# Patient Record
Sex: Male | Born: 1990 | Race: Black or African American | Hispanic: No | Marital: Single | Smoking: Current every day smoker
Health system: Southern US, Community
[De-identification: ages and names within clinical notes are randomized; demographics above are authoritative.]

---

## 2014-10-12 ENCOUNTER — Encounter (HOSPITAL_COMMUNITY): Payer: Self-pay | Admitting: Emergency Medicine

## 2014-10-12 ENCOUNTER — Emergency Department (HOSPITAL_COMMUNITY)
Admission: EM | Admit: 2014-10-12 | Discharge: 2014-10-12 | Disposition: A | Discharge status: Home / Self Care | Payer: Worker's Compensation | Attending: Emergency Medicine | Admitting: Emergency Medicine

## 2014-10-12 DIAGNOSIS — Y998 Other external cause status: Secondary | ICD-10-CM | POA: Diagnosis not present

## 2014-10-12 DIAGNOSIS — R05 Cough: Secondary | ICD-10-CM | POA: Diagnosis not present

## 2014-10-12 DIAGNOSIS — Z720 Tobacco use: Secondary | ICD-10-CM | POA: Insufficient documentation

## 2014-10-12 DIAGNOSIS — S61002A Unspecified open wound of left thumb without damage to nail, initial encounter: Secondary | ICD-10-CM | POA: Insufficient documentation

## 2014-10-12 DIAGNOSIS — Y9389 Activity, other specified: Secondary | ICD-10-CM | POA: Insufficient documentation

## 2014-10-12 DIAGNOSIS — Y9289 Other specified places as the place of occurrence of the external cause: Secondary | ICD-10-CM | POA: Insufficient documentation

## 2014-10-12 DIAGNOSIS — Y288XXA Contact with other sharp object, undetermined intent, initial encounter: Secondary | ICD-10-CM | POA: Insufficient documentation

## 2014-10-12 DIAGNOSIS — S61012A Laceration without foreign body of left thumb without damage to nail, initial encounter: Secondary | ICD-10-CM | POA: Diagnosis present

## 2014-10-12 DIAGNOSIS — R059 Cough, unspecified: Secondary | ICD-10-CM

## 2014-10-12 MED ORDER — BACITRACIN ZINC 500 UNIT/GM EX OINT
TOPICAL_OINTMENT | CUTANEOUS | Status: AC
  Administered 2014-10-12: 1
  Filled 2014-10-12: qty 0.9

## 2014-10-12 NOTE — ED Provider Notes (Signed)
CSN: [id number]     Arrival date & time 10/12/14  0732 History   First MD Initiated Contact with Patient 10/12/14 [phone]     Chief Complaint  Patient presents with  . Extremity Laceration    left pinky and thumb     The history is provided by the patient. No language interpreter was used.   [name] presents for evaluation of thumb wound and cough. 2 days ago he cut his left thumb and pinky finger on a bolt. He comes in for evaluation to make sure that the area is not infected. He has some local pain with pressure over the thumb. He denies any numbness, weakness. He denies any fevers. His tetanus shot is up-to-date. He denies any medical illness. He is a current every day smoker. His second complaint is cough. He reports 1 month of nonproductive cough. He states that he started with a cold and then the cough never went away. Cough is worse at night. He states there is a lot of dust at work and he thinks this may be contributing. He denies any chest pain, leg swelling, leg pain. Symptoms are mild and constant.  History reviewed. No pertinent past medical history. History reviewed. No pertinent past surgical history. No family history on file. History  Substance Use Topics  . Smoking status: Current Every Day Smoker    Types: Cigarettes  . Smokeless tobacco: Not on file  . Alcohol Use: Yes    Review of Systems  All other systems reviewed and are negative.     Allergies  Review of patient's allergies indicates no known allergies.  Home Medications   Prior to Admission medications   Not on File   BP 144/78 mmHg  Pulse 78  Temp(Src) 97.4 F (36.3 C) (Oral)  Resp 17  SpO2 100% Physical Exam  Constitutional: He is oriented to person, place, and time. He appears well-developed and well-nourished.  HENT:  Head: Normocephalic and atraumatic.  Mouth/Throat: Oropharynx is clear and moist.  Eyes: Pupils are equal, round, and reactive to light.  Neck: Neck supple.  Cardiovascular:  Normal rate and regular rhythm.   No murmur heard. Pulmonary/Chest: Effort normal and breath sounds normal. No respiratory distress.  Abdominal: Soft. There is no tenderness. There is no rebound and no guarding.  Musculoskeletal:  Left thumb with c shaped avulsion injury to pulp.  There is mild edema without erythema or drainage.  Full flexion/extension intact at IP joint.  Left fifth digit with abrasion injury to pulp without erythema or edema.  Flexion and extension intact throughout digit.  2+ radial pulses.    Neurological: He is alert and oriented to person, place, and time.  5/5 strength out in BUE, sensation to light touch intact throughout bilateral hands.    Nursing note and vitals reviewed.   ED Course  Procedures (including critical care time) Labs Review Labs Reviewed - No data to display  Imaging Review No results found.   EKG Interpretation None      MDM   Final diagnoses:  Open wound of thumb without complication, left, initial encounter  Cough    Patient here for evaluation of hand wound and cough. In terms of wound to the hand, this appears to be healing appropriately. There is no current evidence of infectious process. Wound was cleansed with safe and water at the bedside. Discussed with patient home care for hand wound as well as return precautions for evidence of infection. Wound does not need debridement  of tissue at this time. In terms of cough patient has no respiratory distress with clear lung exam. Clinical picture is not consistent with pneumonia, CHF, reactive airway disease. Discussed with patient home care for cough, recommend smoking cessation for wound healing and for cough. Discussed PCP follow-up and return precautions.    Tilden Fossa, MD 10/12/14 [phone]

## 2014-10-12 NOTE — ED Notes (Addendum)
Pt in room 25 with Eliezer Loftsarrie Hall whom is triaging pt at present time. Pt ambulatory to room; NAD.

## 2014-10-12 NOTE — Discharge Instructions (Signed)
Cough, Adult  A cough is a reflex that helps clear your throat and airways. It can help heal the body or may be a reaction to an irritated airway. A cough may only last 2 or 3 weeks (acute) or may last more than 8 weeks (chronic).  CAUSES Acute cough:  Viral or bacterial infections. Chronic cough:  Infections.  Allergies.  Asthma.  Post-nasal drip.  Smoking.  Heartburn or acid reflux.  Some medicines.  Chronic lung problems (COPD).  Cancer. SYMPTOMS   Cough.  Fever.  Chest pain.  Increased breathing rate.  High-pitched whistling sound when breathing (wheezing).  Colored mucus that you cough up (sputum). TREATMENT   A bacterial cough may be treated with antibiotic medicine.  A viral cough must run its course and will not respond to antibiotics.  Your caregiver may recommend other treatments if you have a chronic cough. HOME CARE INSTRUCTIONS   Only take over-the-counter or prescription medicines for pain, discomfort, or fever as directed by your caregiver. Use cough suppressants only as directed by your caregiver.  Use a cold steam vaporizer or humidifier in your bedroom or home to help loosen secretions.  Sleep in a semi-upright position if your cough is worse at night.  Rest as needed.  Stop smoking if you smoke. SEEK IMMEDIATE MEDICAL CARE IF:   You have pus in your sputum.  Your cough starts to worsen.  You cannot control your cough with suppressants and are losing sleep.  You begin coughing up blood.  You have difficulty breathing.  You develop pain which is getting worse or is uncontrolled with medicine.  You have a fever. MAKE SURE YOU:   Understand these instructions.  Will watch your condition.  Will get help right away if you are not doing well or get worse. Document Released: 01/18/2011 Document Revised: 10/14/2011 Document Reviewed: 01/18/2011 [facility] Patient Information 2015 [place], [place]. This information is not intended  to replace advice given to you by your health care provider. Make sure you discuss any questions you have with your health care provider.   Deep Skin Avulsion A deep skin avulsion is when all layers of the skin or parts of body structures have been torn away. This is usually a result of severe injury (trauma). A deep skin avulsion can include damage to important structures beneath the skin such as tendons, ligaments, nerves, or blood vessels.  CAUSES  Many injuries can lead to a deep skin avulsion. These include:   Crush injuries.  Bites.  Falls against jagged surfaces.  Gunshot wounds.  Severe burns and injuries involving dragging (such as those from a bicycle or motorcycle accident). TREATMENT   If the wound is small and there is no damage to vital structures like nerves and blood vessels, the damaged tissues may be removed. Then, the wound can be cleaned thoroughly and closed.  A skin graft may be performed. This is a procedure in which the outer layer of skin is removed from a different part of your body. That skin (skin graft) is used to cover the open wound. This can happen after damaged tissue is removed and repairs are completed.  Your caregiver may onlyapply a bandage (dressing) to the wound. The wound will be kept clean and allowed to heal. Healing can take weeks or months and usually leaves a large scar. This type of treatment is only done if your caregiver feels that skin grafting or a similar procedure would not work. You might need a tetanus shot  if:  You cannot remember when you had your last tetanus shot.  You have never had a tetanus shot.  The injury broke your skin. If you got a tetanus shot, your arm may swell, get red, and feel warm to the touch. This is common and not a problem. If you need a tetanus shot and you choose not to have one, there is a rare chance of getting tetanus. Sickness from tetanus can be serious. HOME CARE INSTRUCTIONS   Only take  over-the-counter or prescription medicines for pain, discomfort, or fever as directed by your caregiver.  Gently wash the area with mild soap and water 2 times a day, or as directed. Rinse off the soap. Pat the area dry with a clean towel. Do not rub the wound. This may cause bleeding.  Follow your caregiver's instructions for how often you need to change the dressing.  Apply ointment and a dressing to the wound as directed.  If the dressing sticks, moisten it with soapy water and gently remove it.  Change the bandage right away if it becomes wet, dirty, or starts to smell bad.  Take showers. Do not take tub baths, swim, or do anything that may soak the wound until it is healed.  Use anti-itch medicine as directed by your caregiver. The wound may itch when it is healing. Do not pick or scratch at the wound.  Follow up with your caregiver for stitches (sutures), staple, or skin adhesive strip removal. SEEK MEDICAL CARE IF:   You have redness, swelling, or increasing pain in your wound.  A red streak or line extends away from the wound.  You have pus coming from the wound.  You notice a bad smell coming from thewound or dressing.  The wound breaks open (edges not staying together) after sutures have been removed.  You notice something coming out of the wound, such as a small piece of wood, glass, or metal.  You are unable to properly move a finger or toe if the wound is on your hand or foot.  You have severe swelling around the wound that causes pain and numbness.  Your arm, hand, leg, or foot changes color. SEEK IMMEDIATE MEDICAL CARE IF:   Your pain becomes severe or is not adequately relieved with pain medicine.  You have a fever.  You have nausea and vomiting for more than 24 hours.  You feel lightheaded, weak, or faint.  You develop chest pain or difficulty breathing. MAKE SURE YOU:   Understand these instructions.  Will watch your condition.  Will get help  right away if you are not doing well or get worse. Document Released: 09/17/2006 Document Revised: 10/14/2011 Document Reviewed: 11/25/2010 [facility] Patient Information 2015 [place], [place]. This information is not intended to replace advice given to you by your health care provider. Make sure you discuss any questions you have with your health care provider. Smoking Cessation Quitting smoking is important to your health and has many advantages. However, it is not always easy to quit since nicotine is a very addictive drug. Oftentimes, people try 3 times or more before being able to quit. This document explains the best ways for you to prepare to quit smoking. Quitting takes hard work and a lot of effort, but you can do it. ADVANTAGES OF QUITTING SMOKING  You will live longer, feel better, and live better.  Your body will feel the impact of quitting smoking almost immediately.  Within 20 minutes, blood pressure decreases. Your pulse  returns to its normal level.  After 8 hours, carbon monoxide levels in the blood return to normal. Your oxygen level increases.  After 24 hours, the chance of having a heart attack starts to decrease. Your breath, hair, and body stop smelling like smoke.  After 48 hours, damaged nerve endings begin to recover. Your sense of taste and smell improve.  After 72 hours, the body is virtually free of nicotine. Your bronchial tubes relax and breathing becomes easier.  After 2 to 12 weeks, lungs can hold more air. Exercise becomes easier and circulation improves.  The risk of having a heart attack, stroke, cancer, or lung disease is greatly reduced.  After 1 year, the risk of coronary heart disease is cut in half.  After 5 years, the risk of stroke falls to the same as a nonsmoker.  After 10 years, the risk of lung cancer is cut in half and the risk of other cancers decreases significantly.  After 15 years, the risk of coronary heart disease drops, usually to the  level of a nonsmoker.  If you are pregnant, quitting smoking will improve your chances of having a healthy baby.  The people you live with, especially any children, will be healthier.  You will have extra money to spend on things other than cigarettes. QUESTIONS TO THINK ABOUT BEFORE ATTEMPTING TO QUIT You may want to talk about your answers with your health care provider.  Why do you want to quit?  If you tried to quit in the past, what helped and what did not?  What will be the most difficult situations for you after you quit? How will you plan to handle them?  Who can help you through the tough times? Your family? Friends? A health care provider?  What pleasures do you get from smoking? What ways can you still get pleasure if you quit? Here are some questions to ask your health care provider:  How can you help me to be successful at quitting?  What medicine do you think would be best for me and how should I take it?  What should I do if I need more help?  What is smoking withdrawal like? How can I get information on withdrawal? GET READY  Set a quit date.  Change your environment by getting rid of all cigarettes, ashtrays, matches, and lighters in your home, car, or work. Do not let people smoke in your home.  Review your past attempts to quit. Think about what worked and what did not. GET SUPPORT AND ENCOURAGEMENT You have a better chance of being successful if you have help. You can get support in many ways.  Tell your family, friends, and coworkers that you are going to quit and need their support. Ask them not to smoke around you.  Get individual, group, or telephone counseling and support. Programs are available at [facility] and health centers. Call your local health department for information about programs in your area.  Spiritual beliefs and practices may help some smokers quit.  Download a "quit meter" on your computer to keep track of quit statistics,  such as how long you have gone without smoking, cigarettes not smoked, and money saved.  Get a self-help book about quitting smoking and staying off tobacco. LEARN NEW SKILLS AND BEHAVIORS  Distract yourself from urges to smoke. Talk to someone, go for a walk, or occupy your time with a task.  Change your normal routine. Take a different route to work. Drink tea  instead of coffee. Eat breakfast in a different place.  Reduce your stress. Take a hot bath, exercise, or read a book.  Plan something enjoyable to do every day. Reward yourself for not smoking.  Explore interactive web-based programs that specialize in helping you quit. GET MEDICINE AND USE IT CORRECTLY Medicines can help you stop smoking and decrease the urge to smoke. Combining medicine with the above behavioral methods and support can greatly increase your chances of successfully quitting smoking.  Nicotine replacement therapy helps deliver nicotine to your body without the negative effects and risks of smoking. Nicotine replacement therapy includes nicotine gum, lozenges, inhalers, nasal sprays, and skin patches. Some may be available over-the-counter and others require a prescription.  Antidepressant medicine helps people abstain from smoking, but how this works is unknown. This medicine is available by prescription.  Nicotinic receptor partial agonist medicine simulates the effect of nicotine in your brain. This medicine is available by prescription. Ask your health care provider for advice about which medicines to use and how to use them based on your health history. Your health care provider will tell you what side effects to look out for if you choose to be on a medicine or therapy. Carefully read the information on the package. Do not use any other product containing nicotine while using a nicotine replacement product.  RELAPSE OR DIFFICULT SITUATIONS Most relapses occur within the first 3 months after quitting. Do not be  discouraged if you start smoking again. Remember, most people try several times before finally quitting. You may have symptoms of withdrawal because your body is used to nicotine. You may crave cigarettes, be irritable, feel very hungry, cough often, get headaches, or have difficulty concentrating. The withdrawal symptoms are only temporary. They are strongest when you first quit, but they will go away within 10-14 days. To reduce the chances of relapse, try to:  Avoid drinking alcohol. Drinking lowers your chances of successfully quitting.  Reduce the amount of caffeine you consume. Once you quit smoking, the amount of caffeine in your body increases and can give you symptoms, such as a rapid heartbeat, sweating, and anxiety.  Avoid smokers because they can make you want to smoke.  Do not let weight gain distract you. Many smokers will gain weight when they quit, usually less than 10 pounds. Eat a healthy diet and stay active. You can always lose the weight gained after you quit.  Find ways to improve your mood other than smoking. FOR MORE INFORMATION  www.smokefree.gov  Document Released: 07/16/2001 Document Revised: 12/06/2013 Document Reviewed: 10/31/2011 [facility] Patient Information 2015 [place], [place]. This information is not intended to replace advice given to you by your health care provider. Make sure you discuss any questions you have with your health care provider.

## 2014-10-12 NOTE — ED Notes (Signed)
Pt states that he cut his left thumb and little finger on 10/10/14 when his hand slipped across a large bolt. Pt wants them looked at to make sure they arent infected.  Pt states that he had pain in fingers when sleeping.

## 2014-10-12 NOTE — ED Notes (Addendum)
Pt appears with approximated laceration to left thumb and pinky covered with band-aid. Impact 2 days ago. Pt reports TDAP 4-5 years ago. Rees at bedside and has pt wash hands to ensure all particles free of injured area. New band-aid applied.  Pt continues to report nonproductive cough for 1-2 months related to dust in environment at work. Pt lungs clear; NAD.

## 2014-10-29 ENCOUNTER — Ambulatory Visit (INDEPENDENT_AMBULATORY_CARE_PROVIDER_SITE_OTHER): Payer: Self-pay | Admitting: Internal Medicine

## 2014-10-29 ENCOUNTER — Ambulatory Visit (INDEPENDENT_AMBULATORY_CARE_PROVIDER_SITE_OTHER): Payer: Self-pay

## 2014-10-29 VITALS — BP 110/70 | HR 86 | Temp 98.5°F | Resp 20 | Ht 71.25 in | Wt 179.5 lb

## 2014-10-29 DIAGNOSIS — R6881 Early satiety: Secondary | ICD-10-CM

## 2014-10-29 DIAGNOSIS — R634 Abnormal weight loss: Secondary | ICD-10-CM

## 2014-10-29 DIAGNOSIS — R61 Generalized hyperhidrosis: Secondary | ICD-10-CM

## 2014-10-29 DIAGNOSIS — R059 Cough, unspecified: Secondary | ICD-10-CM

## 2014-10-29 DIAGNOSIS — D709 Neutropenia, unspecified: Secondary | ICD-10-CM

## 2014-10-29 DIAGNOSIS — R05 Cough: Secondary | ICD-10-CM

## 2014-10-29 DIAGNOSIS — D696 Thrombocytopenia, unspecified: Secondary | ICD-10-CM

## 2014-10-29 DIAGNOSIS — R1013 Epigastric pain: Secondary | ICD-10-CM

## 2014-10-29 LAB — COMPREHENSIVE METABOLIC PANEL
ALBUMIN: 3.9 g/dL (ref 3.5–5.2)
ALT: 38 U/L (ref 0–53)
AST: 53 U/L — AB (ref 0–37)
Alkaline Phosphatase: 47 U/L (ref 39–117)
BUN: 11 mg/dL (ref 6–23)
CO2: 25 mEq/L (ref 19–32)
Calcium: 8.4 mg/dL (ref 8.4–10.5)
Chloride: 97 mEq/L (ref 96–112)
Creat: 1.24 mg/dL (ref 0.50–1.35)
GLUCOSE: 97 mg/dL (ref 70–99)
Potassium: 3.4 mEq/L — ABNORMAL LOW (ref 3.5–5.3)
SODIUM: 133 meq/L — AB (ref 135–145)
Total Bilirubin: 0.4 mg/dL (ref 0.2–1.2)
Total Protein: 7.2 g/dL (ref 6.0–8.3)

## 2014-10-29 LAB — POCT CBC
Granulocyte percent: 60.8 %G (ref 37–80)
HEMATOCRIT: 53.8 % — AB (ref 43.5–53.7)
HEMOGLOBIN: 16.6 g/dL (ref 14.1–18.1)
Lymph, poc: 1 (ref 0.6–3.4)
MCH, POC: 25.7 pg — AB (ref 27–31.2)
MCHC: 30.9 g/dL — AB (ref 31.8–35.4)
MCV: 83.1 fL (ref 80–97)
MID (cbc): 0.2 (ref 0–0.9)
MPV: 9.4 fL (ref 0–99.8)
POC GRANULOCYTE: 1.8 — AB (ref 2–6.9)
POC LYMPH %: 33.2 % (ref 10–50)
POC MID %: 6 % (ref 0–12)
Platelet Count, POC: 99 10*3/uL — AB (ref 142–424)
RBC: 6.47 M/uL — AB (ref 4.69–6.13)
RDW, POC: 14.8 %
WBC: 3 10*3/uL — AB (ref 4.6–10.2)

## 2014-10-29 MED ORDER — AZITHROMYCIN 500 MG PO TABS: 500.0000 mg | ORAL_TABLET | Freq: Every day | ORAL | Status: DC

## 2014-10-29 MED ORDER — HYDROCODONE-HOMATROPINE 5-1.5 MG/5ML PO SYRP: 5.0000 mL | ORAL_SOLUTION | Freq: Four times a day (QID) | ORAL | Status: DC | PRN

## 2014-10-29 MED ORDER — RANITIDINE HCL 150 MG PO TABS: 150.0000 mg | ORAL_TABLET | Freq: Two times a day (BID) | ORAL | Status: DC

## 2014-10-29 NOTE — Progress Notes (Signed)
Subjective:   This chart was scribed for Ellamae Sia, MD by Jarvis Morgan, Medical Scribe. This patient was seen in Room 13 and the patient's care was started at 1:38 PM.   Patient ID: Jason Paul, male    DOB: [birth year], 24 y.o.   MRN: [id number]  Chief Complaint  Patient presents with  . Cough    cough x several months.  worse in the last week.  unable to sleep  . Diarrhea    unable to eat anything due to diarrhea  . Abdominal Pain    all over, pt feels that this is from not being able to eat.  pt stated he has had a 10 lb weight loss    HPI HPI Comments: Jason Paul is a 24 y.o. male who presents to the [facility] complaining of intermittent cough for 2-3 months. Pt states it is a dry cough and it is worse at night. He denies any SOB or SOB with activity. He denies any h/o asthma. Pt notes associated fever, night sweats, abdominal pain and appetite change. He states that the appetite change is that he has noticed himself getting fuller quicker and unable to finish meals. He denies any h/o ulcers. Pt reports that last week he had generalized body aches and diarrhea for which he lost about 10 pounds. He notes he has loose stools mostly at night. He denies any recent travel. He denies any TB exposure that he knows of. Pt denies drinking alcohol or coffee. He denies drinking or eating anything that causes stomach discomfort. No HIV risk. He denies any h/o IV drug use. denies any rhinorrhea, allergy symptoms or fatigue.  There are no active problems to display for this patient.  History reviewed. No pertinent past medical history. History reviewed. No pertinent past surgical history. No Known Allergies Prior to Admission medications   Not on File   History   Social History  . Marital Status: Single    Spouse Name: N/A  . Number of Children: N/A  . Years of Education: N/A   Occupational History  . warehouse   Social History Main Topics  .  Smoking status: Former Smoker    Types: Cigarettes  . Smokeless tobacco: Not on file  . Alcohol Use: 0.0 oz/week    0 Standard drinks or equivalent per week  . Drug Use: No  . Sexual Activity: Not on file   Family is from [place] although he is never been there--- [facility]  Review of Systems All systems are negative except as noted in the HPI and PMH.      Objective:   Physical Exam  Constitutional: He is oriented to person, place, and time. He appears well-developed and well-nourished. No distress.  HENT:  Head: Normocephalic and atraumatic.  Right Ear: External ear normal.  Left Ear: External ear normal.  Nose: Nose normal.  Mouth/Throat: Oropharynx is clear and moist.  Eyes: Conjunctivae and EOM are normal. Pupils are equal, round, and reactive to light.  Neck: Neck supple. No thyromegaly present.  Cardiovascular: Normal rate, regular rhythm, normal heart sounds and intact distal pulses.   No murmur heard. Pulmonary/Chest: Effort normal and breath sounds normal. No respiratory distress. He has no wheezes.  Abdominal: Soft. Bowel sounds are normal. He exhibits no mass. There is no tenderness.  Mild tenderness in epigastrium   Musculoskeletal: Normal range of motion. He exhibits no edema.  Lymphadenopathy:    He has no cervical adenopathy.  Neurological: He is alert and oriented to person, place, and time.  Skin: Skin is warm and dry.  Psychiatric: He has a normal mood and affect. His behavior is normal.  Nursing note and vitals reviewed.   Filed Vitals:   10/29/14 1329  BP: 110/70  Pulse: 86  Temp: 98.5 F (36.9 C)  TempSrc: Oral  Resp: 20  Height: 5' 11.25" (1.81 m)  Weight: 179 lb 8 oz (81.421 kg)  SpO2: 99%   Results for orders placed or performed in visit on 10/29/14  POCT CBC  Result Value Ref Range   WBC 3.0 (A) 4.6 - 10.2 K/uL   Lymph, poc 1.0 0.6 - 3.4   POC LYMPH PERCENT 33.2 10 - 50 %L   MID (cbc) 0.2 0 - 0.9   POC MID % 6.0 0 - 12 %M     POC Granulocyte 1.8 (A) 2 - 6.9   Granulocyte percent 60.8 37 - 80 %G   RBC 6.47 (A) 4.69 - 6.13 M/uL   Hemoglobin 16.6 14.1 - 18.1 g/dL   HCT, POC 40.953.8 (A) 81.143.5 - 53.7 %   MCV 83.1 80 - 97 fL   MCH, POC 25.7 (A) 27 - 31.2 pg   MCHC 30.9 (A) 31.8 - 35.4 g/dL   RDW, POC 91.414.8 %   Platelet Count, POC 99 (A) 142 - 424 K/uL   MPV 9.4 0 - 99.8 fL    [facility] reading (PRIMARY) by  Dr. Merla Richesoolittle CXR shows right middle lobe infiltrate    Assessment & Plan:  Cough - Plan: DG Chest 2 View  Loss of weight - Plan: DG Chest 2 View, POCT CBC, Comprehensive metabolic panel  Early satiety - Plan: POCT CBC  Dyspepsia - Plan: POCT CBC  Night sweats  Neutropenia - Plan: Pathologist smear review  Thrombocytopenia - Plan: Pathologist smear review  Meds ordered this encounter  Medications  . azithromycin (ZITHROMAX) 500 MG tablet    Sig: Take 1 tablet (500 mg total) by mouth daily.    Dispense:  5 tablet    Refill:  0  . HYDROcodone-homatropine (HYCODAN) 5-1.5 MG/5ML syrup    Sig: Take 5 mLs by mouth every 6 (six) hours as needed.    Dispense:  120 mL    Refill:  0  . ranitidine (ZANTAC) 150 MG tablet    Sig: Take 1 tablet (150 mg total) by mouth 2 (two) times daily. For stomach    Dispense:  60 tablet    Refill:  0   F/u will be needed  I have completed the patient encounter in its entirety as documented by the scribe, with editing by me where necessary. Robert P. Merla Richesoolittle, M.D.

## 2014-10-30 ENCOUNTER — Encounter: Payer: Self-pay | Admitting: Internal Medicine

## 2014-10-31 LAB — PATHOLOGIST SMEAR REVIEW

## 2016-08-02 ENCOUNTER — Emergency Department: Payer: Self-pay

## 2016-08-02 ENCOUNTER — Encounter: Payer: Self-pay | Admitting: Emergency Medicine

## 2016-08-02 ENCOUNTER — Emergency Department
Admission: EM | Admit: 2016-08-02 | Discharge: 2016-08-02 | Disposition: A | Discharge status: Home / Self Care | Payer: Self-pay | Attending: Emergency Medicine | Admitting: Emergency Medicine

## 2016-08-02 DIAGNOSIS — J069 Acute upper respiratory infection, unspecified: Secondary | ICD-10-CM | POA: Insufficient documentation

## 2016-08-02 DIAGNOSIS — B9789 Other viral agents as the cause of diseases classified elsewhere: Secondary | ICD-10-CM

## 2016-08-02 DIAGNOSIS — F17210 Nicotine dependence, cigarettes, uncomplicated: Secondary | ICD-10-CM | POA: Insufficient documentation

## 2016-08-02 DIAGNOSIS — Z79899 Other long term (current) drug therapy: Secondary | ICD-10-CM | POA: Insufficient documentation

## 2016-08-02 MED ORDER — IPRATROPIUM-ALBUTEROL 0.5-2.5 (3) MG/3ML IN SOLN
3.0000 mL | Freq: Once | RESPIRATORY_TRACT | Status: AC
  Administered 2016-08-02: 3 mL via RESPIRATORY_TRACT
  Filled 2016-08-02: qty 3

## 2016-08-02 NOTE — ED Provider Notes (Signed)
[facility] Emergency Department Provider Note   ____________________________________________    I have reviewed the triage vital signs and the nursing notes.   HISTORY  Chief Complaint Cough     HPI Jason Paul is a 25 y.o. male who presents with complaints of cough, fatigue, myalgias for 5 days. Yesterday he had mild shortness of breath but feels well now. He does smoke cigarettes. No history of asthma. No nausea or vomiting or chest pain. No recent travel or calf pain or swelling. Subjective fevers   History reviewed. No pertinent past medical history.  There are no active problems to display for this patient.   History reviewed. No pertinent surgical history.  Prior to Admission medications   Medication Sig Start Date End Date Taking? Authorizing Provider  azithromycin (ZITHROMAX) 500 MG tablet Take 1 tablet (500 mg total) by mouth daily. 10/29/14   Tonye Pearsonobert P Doolittle, MD  HYDROcodone-homatropine [facility]HYCODAN) 5-1.5 MG/5ML syrup Take 5 mLs by mouth every 6 (six) hours as needed. 10/29/14   Tonye Pearsonobert P Doolittle, MD  ranitidine (ZANTAC) 150 MG tablet Take 1 tablet (150 mg total) by mouth 2 (two) times daily. For stomach 10/29/14   Tonye Pearsonobert P Doolittle, MD     Allergies Patient has no known allergies.  Family History  Problem Relation Age of Onset  . Diabetes Mother     Social History Social History  Substance Use Topics  . Smoking status: Current Every Day Smoker    Types: Cigarettes  . Smokeless tobacco: Never Used  . Alcohol use 0.0 oz/week    Review of Systems  Constitutional: As above  ENT: Nasal congestion   Gastrointestinal: No abdominal pain.  No nausea, no vomiting.    Musculoskeletal: Positive for myalgias Skin: Negative for rash. Neurological: Negative for headaches     ____________________________________________   PHYSICAL EXAM:  VITAL SIGNS: ED Triage Vitals  Enc Vitals Group     BP 08/02/16 0508 128/72   Pulse Rate 08/02/16 0508 88     Resp 08/02/16 0508 20     Temp 08/02/16 0508 100 F (37.8 C)     Temp src --      SpO2 08/02/16 0508 97 %     Weight 08/02/16 0506 200 lb (90.7 kg)     Height 08/02/16 0506 6\' 1"  (1.854 m)     Head Circumference --      Peak Flow --      Pain Score --      Pain Loc --      Pain Edu? --      Excl. in GC? --     Constitutional: Alert and oriented. No acute distress. Pleasant and interactive Eyes: Conjunctivae are normal.  Head: Atraumatic.  Mouth/Throat: Mucous membranes are moist.   Cardiovascular: Normal rate, regular rhythm.  Respiratory: Normal respiratory effort.  No retractions.Clear to auscultation bilaterally Genitourinary: deferred Musculoskeletal: No lower extremity tenderness nor edema.   Neurologic:  Normal speech and language. No gross focal neurologic deficits are appreciated.   Skin:  Skin is warm, dry and intact. No rash noted.   ____________________________________________   LABS (all labs ordered are listed, but only abnormal results are displayed)  Labs Reviewed - No data to display ____________________________________________  EKG   ____________________________________________  RADIOLOGY  Chest x-ray unremarkable ____________________________________________   PROCEDURES  Procedure(s) performed:No    Critical Care performed: No ____________________________________________   INITIAL IMPRESSION / ASSESSMENT AND PLAN / ED COURSE  Pertinent labs & imaging  results that were available during my care of the patient were reviewed by me and considered in my medical decision making (see chart for details).  Patient overall well-appearing and in no acute distress. Does have elevated temperature but otherwise vitals are unremarkable. Suspect upper respiratory infection, likely viral, we will give DuoNeb and check chest x-ray and reevaluate  Patient felt significant better after DuoNeb, chest x-ray is benign. He  remains well-appearing. Most consistent with viral upper respiratory infection. Return precautions discussed. Recommend supportive care ____________________________________________   FINAL CLINICAL IMPRESSION(S) / ED DIAGNOSES  Final diagnoses:  Viral URI with cough      NEW MEDICATIONS STARTED DURING THIS VISIT:  New Prescriptions   No medications on file     Note:  This document was prepared using Dragon voice recognition software and may include unintentional dictation errors.     Jene Everyobert Johnye Kist, MD 08/02/16 [phone]

## 2016-08-02 NOTE — ED Triage Notes (Signed)
Patient ambulatory to triage with steady gait, without difficulty or distress noted; pt reports since Christmas having nonprod cough

## 2016-08-02 NOTE — ED Notes (Signed)
Pt to ed with c/o nonproductive cough and congestion since Christmas.  Pt denies fever, denies sore throat, denies earache, skin warm and dry.  Pt with mild wheezing noted on expiration bilat.  Breathing treatment being given at this time. See MAR.  Pt alert and oriented.

## 2020-04-24 ENCOUNTER — Emergency Department: Payer: Self-pay

## 2020-04-24 ENCOUNTER — Emergency Department
Admission: EM | Admit: 2020-04-24 | Discharge: 2020-04-24 | Disposition: A | Discharge status: Home / Self Care | Payer: Self-pay | Attending: Emergency Medicine | Admitting: Emergency Medicine

## 2020-04-24 ENCOUNTER — Other Ambulatory Visit: Payer: Self-pay

## 2020-04-24 DIAGNOSIS — F17210 Nicotine dependence, cigarettes, uncomplicated: Secondary | ICD-10-CM | POA: Insufficient documentation

## 2020-04-24 DIAGNOSIS — M79644 Pain in right finger(s): Secondary | ICD-10-CM | POA: Insufficient documentation

## 2020-04-24 DIAGNOSIS — M25562 Pain in left knee: Secondary | ICD-10-CM | POA: Insufficient documentation

## 2020-04-24 MED ORDER — MELOXICAM 15 MG PO TABS: 15.0000 mg | ORAL_TABLET | Freq: Every day | ORAL | 1 refills | Status: AC

## 2020-04-24 MED ORDER — METHOCARBAMOL 500 MG PO TABS: 500.0000 mg | ORAL_TABLET | Freq: Three times a day (TID) | ORAL | 0 refills | Status: DC | PRN

## 2020-04-24 MED ORDER — METHOCARBAMOL 500 MG PO TABS: 500.0000 mg | ORAL_TABLET | Freq: Three times a day (TID) | ORAL | 0 refills | Status: AC | PRN

## 2020-04-24 MED ORDER — MELOXICAM 15 MG PO TABS: 15.0000 mg | ORAL_TABLET | Freq: Every day | ORAL | 1 refills | Status: DC
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
## 2022-06-17 ENCOUNTER — Other Ambulatory Visit: Payer: Self-pay

## 2022-06-17 ENCOUNTER — Emergency Department
Admission: EM | Admit: 2022-06-17 | Discharge: 2022-06-17 | Disposition: A | Discharge status: Home / Self Care | Payer: Medicaid Other | Attending: Emergency Medicine | Admitting: Emergency Medicine

## 2022-06-17 ENCOUNTER — Encounter: Payer: Self-pay | Admitting: Emergency Medicine

## 2022-06-17 DIAGNOSIS — U071 COVID-19: Secondary | ICD-10-CM | POA: Insufficient documentation

## 2022-06-17 LAB — RESP PANEL BY RT-PCR (FLU A&B, COVID) ARPGX2
Influenza A by PCR: NEGATIVE
Influenza B by PCR: NEGATIVE
SARS Coronavirus 2 by RT PCR: POSITIVE — AB

## 2022-06-17 NOTE — Discharge Instructions (Signed)
Take over-the-counter vitamin C, vitamin D, and zinc to help boost your immune system Take over-the-counter Mucinex for cough as needed Take over-the-counter Tylenol and ibuprofen for fever and body aches Follow-up with your regular doctor as needed Return emergency department worsening Quarantine for 5 days if you are vaccinated and 7 days if you are not.  Jason Paul is from onset of symptoms.  At that time you may return to school/work if your symptoms have improved and you have been fever free for 24 hours.

## 2022-06-17 NOTE — ED Notes (Signed)
See triage note.  Presents with body aches   and low grade temp

## 2022-06-17 NOTE — ED Triage Notes (Signed)
C/o fever and body aches x 1 day.    AAOx3.   Skin warm and dry. NAD

## 2022-06-17 NOTE — ED Provider Notes (Signed)
[facility] Provider Note    Event Date/Time   First MD Initiated Contact with Patient 06/17/22 1737     (approximate)   History   Fever   HPI  Jason Paul is a 31 y.o. male presents emergency department with fever, chills and body aches with diarrhea that started this morning, fever and chills started yesterday.  Patient states he stayed in bed all day yesterday because he felt tired and achy.  No vomiting.  States another housemate started having same symptoms today.      Physical Exam   Triage Vital Signs: ED Triage Vitals  Enc Vitals Group     BP 06/17/22 1645 110/80     Pulse Rate 06/17/22 1645 89     Resp 06/17/22 1645 16     Temp 06/17/22 1645 100 F (37.8 C)     Temp Source 06/17/22 1645 Oral     SpO2 06/17/22 1645 100 %     Weight 06/17/22 1613 220 lb 0.3 oz (99.8 kg)     Height 06/17/22 1613 6\' 1"  (1.854 m)     Head Circumference --      Peak Flow --      Pain Score 06/17/22 1613 6     Pain Loc --      Pain Edu? --      Excl. in GC? --     Most recent vital signs: Vitals:   06/17/22 1645  BP: 110/80  Pulse: 89  Resp: 16  Temp: 100 F (37.8 C)  SpO2: 100%     General: Awake, no distress.   CV:  Good peripheral perfusion. regular rate and  rhythm Resp:  Normal effort. Lungs CTA Abd:  No distention.  Nontender, bowel sounds normal Other:      ED Results / Procedures / Treatments   Labs (all labs ordered are listed, but only abnormal results are displayed) Labs Reviewed  RESP PANEL BY RT-PCR (FLU A&B, COVID) ARPGX2 - Abnormal; Notable for the following components:      Result Value   SARS Coronavirus 2 by RT PCR POSITIVE (*)    All other components within normal limits     EKG     RADIOLOGY     PROCEDURES:   Procedures   MEDICATIONS ORDERED IN ED: Medications - No data to display   IMPRESSION / MDM / ASSESSMENT AND PLAN / ED COURSE  I reviewed the triage vital signs and the nursing notes.                               Differential diagnosis includes, but is not limited to, COVID, influenza, viral gastroenteritis  Patient's presentation is most consistent with acute complicated illness / injury requiring diagnostic workup.   Respiratory panel obtained  COVID test positive.  Negative for influenza.  Patient is not vaccinated so I did instruct him to stay out of work for 1 week.  He is to quarantine himself at home.  Take over-the-counter comfort medications.  Return emergency department if worsening.  Patient is in agreement treatment plan.  Discharged stable condition.     FINAL CLINICAL IMPRESSION(S) / ED DIAGNOSES   Final diagnoses:  COVID     Rx / DC Orders   ED Discharge Orders     None        Note:  This document was prepared using Dragon voice recognition software and may include  unintentional dictation errors.    Faythe Ghee, PA-C 06/17/22 Jonetta Osgood, MD 06/17/22 [phone]

## 2024-05-26 ENCOUNTER — Emergency Department
Admission: EM | Admit: 2024-05-26 | Discharge: 2024-05-26 | Disposition: A | Discharge status: Home / Self Care | Attending: Emergency Medicine | Admitting: Emergency Medicine

## 2024-05-26 ENCOUNTER — Other Ambulatory Visit: Payer: Self-pay

## 2024-05-26 ENCOUNTER — Emergency Department

## 2024-05-26 DIAGNOSIS — Y92007 Garden or yard of unspecified non-institutional (private) residence as the place of occurrence of the external cause: Secondary | ICD-10-CM | POA: Diagnosis not present

## 2024-05-26 DIAGNOSIS — M25571 Pain in right ankle and joints of right foot: Secondary | ICD-10-CM | POA: Diagnosis present

## 2024-05-26 DIAGNOSIS — W1842XA Slipping, tripping and stumbling without falling due to stepping into hole or opening, initial encounter: Secondary | ICD-10-CM | POA: Insufficient documentation

## 2024-05-26 NOTE — ED Triage Notes (Signed)
 Patient wheeled to triage with complaints of right ankle pain. 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 States inability to bear weight since incident. Patient able to wiggle toes, pulses, and cap refill intact.

## 2024-05-26 NOTE — ED Provider Notes (Signed)
 [facility] Emergency Department Provider Note     Event Date/Time   First MD Initiated Contact with Patient 05/26/24 2007     (approximate)   History   Ankle Pain   HPI  Jason Paul is a 33 y.o. male with no significant past medical history presents to the ED for evaluation of right ankle pain x today.  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  Patient reports pain with bearing weight.  Noted swelling to lateral aspect of ankle.      Physical Exam   Triage Vital Signs: ED Triage Vitals  Encounter Vitals Group     BP 05/26/24 1941 139/82     Girls Systolic BP Percentile --      Girls Diastolic BP Percentile --      Boys Systolic BP Percentile --      Boys Diastolic BP Percentile --      Pulse Rate 05/26/24 1941 69     Resp 05/26/24 1941 16     Temp 05/26/24 1941 98.5 F (36.9 C)     Temp Source 05/26/24 1941 Oral     SpO2 05/26/24 1941 100 %     Weight 05/26/24 1942 215 lb (97.5 kg)     Height 05/26/24 1942 6' 1 (1.854 m)     Head Circumference --      Peak Flow --      Pain Score 05/26/24 1941 9     Pain Loc --      Pain Education --      Exclude from Growth Chart --     Most recent vital signs: Vitals:   05/26/24 1941  BP: 139/82  Pulse: 69  Resp: 16  Temp: 98.5 F (36.9 C)  SpO2: 100%    General Awake, no distress.  HEENT NCAT.  CV:  Good peripheral perfusion.  RESP:  Normal effort.  ABD:  No distention.  Other:  Right ankle reveals notable swelling to lateral malleolus.  Tenderness to palpation.  Neurovascular status intact all throughout.  Pedal pulses palpated 2+.  Limited dorsiflexion plantarflexion secondary to pain.   ED Results / Procedures / Treatments   Labs (all labs ordered are listed, but only abnormal results are displayed) Labs Reviewed - No data to display  RADIOLOGY  I personally viewed and evaluated these images as part of my medical decision  making, as well as reviewing the written report by the radiologist.  ED Provider Interpretation: No acute bony abnormalities  DG Ankle Complete Right Result Date: 05/26/2024 CLINICAL DATA:  ankle swelling EXAM: RIGHT ANKLE - COMPLETE 3+ VIEW COMPARISON:  None Available. FINDINGS: No acute fracture or dislocation. No ankle mortise widening. The talar dome is intact. There is no evidence of arthropathy or other focal bone abnormality. Soft tissues are unremarkable. Small Achilles insertion enthesophyte. IMPRESSION: No acute fracture or dislocation. Electronically Signed   By: Rogelia Myers M.D.   On: 05/26/2024 20:06    PROCEDURES:  Critical Care performed: No  Procedures   MEDICATIONS ORDERED IN ED: Medications - No data to display   IMPRESSION / MDM / ASSESSMENT AND PLAN / ED COURSE  I reviewed the triage vital signs and the nursing notes.                              Clinical Course as of 05/26/24 2052  Wed May 26, 2024  2047 DG Ankle Complete Right IMPRESSION: No acute fracture or dislocation.   [MH]    Clinical Course User Index [MH] Margrette Monte A, PA-C    33 y.o. male presents to the emergency department for evaluation and treatment of right ankle pain. See HPI for further details.   Differential diagnosis includes, but is not limited to fracture, dislocation, sprain, hematoma, contusion  Patient's presentation is most consistent with acute complicated illness / injury requiring diagnostic workup.  Patient is alert and oriented.  He is hemodynamic stable.  Physical exam findings are stated above and overall benign.  X-rays are reassuring.  Patient will be placed in lace up ankle brace and provided with crutches for nonweightbearing status for 1 week with gradual increase.  RICE therapy education provided.  Patient stable condition for discharge home.  Advised to follow-up with orthopedic if symptoms do not improve.  ED return precaution discussed.  FINAL CLINICAL  IMPRESSION(S) / ED DIAGNOSES   Final diagnoses:  Acute right ankle pain   Rx / DC Orders   ED Discharge Orders     None        Note:  This document was prepared using Dragon voice recognition software and may include unintentional dictation errors.    Margrette, Aviraj Kentner A, PA-C 05/26/24 2052    Levander Slate, MD 05/27/24 [phone]

## 2024-05-26 NOTE — Discharge Instructions (Signed)
 Your evaluated in the ED for right ankle pain.  Your x-ray is normal.  There is no broken bone or fracture.  You have been provided with a brace and crutches.  We encouraged nonweightbearing status for 1 week with the assistance of the crutches with gradual increase as tolerated.  Apply ice to the affected area.  Follow-up with orthopedics if symptoms do not improve.  Pain control:  Ibuprofen (motrin/aleve/advil) - You can take 3 tablets (600 mg) every 6 hours as needed for pain/fever.  Acetaminophen (tylenol) - You can take 2 extra strength tablets (1000 mg) every 6 hours as needed for pain/fever.  You can alternate these medications or take them together.  Make sure you eat food/drink water when taking these medications.
